# Patient Record
Sex: Female | Born: 1975 | Race: Black or African American | Hispanic: No | Marital: Single | Smoking: Current every day smoker
Health system: Southern US, Community
[De-identification: ages and names within clinical notes are randomized; demographics above are authoritative.]

## PROBLEM LIST (undated history)

## (undated) HISTORY — PX: TUBAL LIGATION: SHX77

---

## 2019-05-08 ENCOUNTER — Emergency Department: Payer: Self-pay

## 2019-05-08 ENCOUNTER — Other Ambulatory Visit: Payer: Self-pay

## 2019-05-08 ENCOUNTER — Encounter: Payer: Self-pay | Admitting: Emergency Medicine

## 2019-05-08 ENCOUNTER — Emergency Department
Admission: EM | Admit: 2019-05-08 | Discharge: 2019-05-08 | Disposition: A | Discharge status: Home / Self Care | Payer: Self-pay | Attending: Emergency Medicine | Admitting: Emergency Medicine

## 2019-05-08 DIAGNOSIS — Y999 Unspecified external cause status: Secondary | ICD-10-CM | POA: Insufficient documentation

## 2019-05-08 DIAGNOSIS — Y9351 Activity, roller skating (inline) and skateboarding: Secondary | ICD-10-CM | POA: Insufficient documentation

## 2019-05-08 DIAGNOSIS — Y929 Unspecified place or not applicable: Secondary | ICD-10-CM | POA: Insufficient documentation

## 2019-05-08 DIAGNOSIS — S93401A Sprain of unspecified ligament of right ankle, initial encounter: Secondary | ICD-10-CM | POA: Insufficient documentation

## 2019-05-08 DIAGNOSIS — F17210 Nicotine dependence, cigarettes, uncomplicated: Secondary | ICD-10-CM | POA: Insufficient documentation

## 2019-05-08 MED ORDER — MELOXICAM 15 MG PO TABS: 15.0000 mg | ORAL_TABLET | Freq: Every day | ORAL | 0 refills | Status: AC

## 2019-05-08 NOTE — ED Triage Notes (Signed)
Injured right ankle yestereday roller skating

## 2019-05-08 NOTE — ED Notes (Signed)
See triage note  Presents s/p fall  States she fell roller skating yesterday  Having pain to right ankle and states pain radiates into calf  Min swelling noted at ankle   Good pulses

## 2019-05-08 NOTE — Discharge Instructions (Signed)
Please follow up with the podiatrist for symptoms that are not improving over the week.  Rest, ice, and elevate your leg off and on over the next few days.  Return to the ER for symptoms of concern if unable to schedule an appointment.

## 2019-05-08 NOTE — ED Provider Notes (Signed)
[facility] Emergency Department Provider Note ____________________________________________  Time seen: Approximately 3:07 PM  I have reviewed the triage vital signs and the nursing notes.   HISTORY  Chief Complaint Ankle Pain    HPI Brighton Delio is a 43 y.o. female who presents to the emergency department for evaluation and treatment of right ankle pain after inversion injury yesterday while roller skating. No previous ankle sprain or fracture. No relief with rest and ibuprofen.  History reviewed. No pertinent past medical history.  There are no active problems to display for this patient.   Past Surgical History:  Procedure Laterality Date  . TUBAL LIGATION      Prior to Admission medications   Medication Sig Start Date End Date Taking? Authorizing Provider  meloxicam (MOBIC) 15 MG tablet Take 1 tablet (15 mg total) by mouth daily. 05/08/19   Victorino Dike, FNP    Allergies Patient has no known allergies.  No family history on file.  Social History Social History   Tobacco Use  . Smoking status: Current Every Day Smoker  . Smokeless tobacco: Never Used  Substance Use Topics  . Alcohol use: Yes  . Drug use: Not on file    Review of Systems Constitutional: Negative for fever. Cardiovascular: Negative for chest pain. Respiratory: Negative for shortness of breath. Musculoskeletal: Positive for right ankle pain. Skin: Positive for swelling over the right ankle.  Neurological: Negative for decrease in sensation  ____________________________________________   PHYSICAL EXAM:  VITAL SIGNS: ED Triage Vitals [05/08/19 1235]  Enc Vitals Group     BP 117/81     Pulse Rate 96     Resp 16     Temp 98.5 F (36.9 C)     Temp Source Oral     SpO2 98 %     Weight 118 lb (53.5 kg)     Height 5\' 2"  (1.575 m)     Head Circumference      Peak Flow      Pain Score 7     Pain Loc      Pain Edu?      Excl. in [place]?      Constitutional: Alert and oriented. Well appearing and in no acute distress. Eyes: Conjunctivae are clear without discharge or drainage Head: Atraumatic Neck: Supple Respiratory: No cough. Respirations are even and unlabored. Musculoskeletal: [place] Ankle rules are negative. Swelling noted over the lateral malleolus without deformity. Neurologic: Motor and sensory function is intact.  Skin: No open wounds or lesions.  Psychiatric: Affect and behavior are appropriate.  ____________________________________________   LABS (all labs ordered are listed, but only abnormal results are displayed)  Labs Reviewed - No data to display ____________________________________________  RADIOLOGY  Image of the right ankle is negative for acute bony abnormality. ____________________________________________   PROCEDURES  .Splint Application  Date/Time: 05/08/2019 3:11 PM Performed by: Victorino Dike, FNP Authorized by: Victorino Dike, FNP   Consent:    Consent obtained:  Verbal   Consent given by:  Patient   Risks discussed:  Pain and swelling Procedure details:    Laterality:  Right   Location:  Ankle   Splint type:  Ankle stirrup   Supplies:  Prefabricated splint Post-procedure details:    Pain:  Unchanged   Patient tolerance of procedure:  Tolerated well, no immediate complications    ____________________________________________   INITIAL IMPRESSION / ASSESSMENT AND PLAN / ED COURSE  Amirra Herling is a 43 y.o. who presents to the  emergency department for treatment and evaluation of right ankle pain. See HPI for details.  X-ray and exam are consistent with ankle sprain. She was placed in a velcro ankle splint and advised to follow up with podiatry if not improving over the week.  She was also instructed to return to the emergency department for symptoms that change or worsen if unable schedule an appointment with podiatry or primary care.  Medications - No data  to display  Pertinent labs & imaging results that were available during my care of the patient were reviewed by me and considered in my medical decision making (see chart for details).  _________________________________________   FINAL CLINICAL IMPRESSION(S) / ED DIAGNOSES  Final diagnoses:  Sprain of right ankle, unspecified ligament, initial encounter    ED Discharge Orders         Ordered    meloxicam (MOBIC) 15 MG tablet  Daily     05/08/19 1348           If controlled substance prescribed during this visit, 12 month history viewed on the [facility] prior to issuing an initial prescription for Schedule II or III opiod.   Chinita Pester, FNP 05/08/19 1511    Emily Filbert, MD 05/09/19 1459

## 2020-02-05 IMAGING — DX DG ANKLE COMPLETE 3+V*R*
3 series · 3 of 3 positions shown · non-contrast
Comparison: None.

CLINICAL DATA: Right ankle pain post fall

EXAM:
RIGHT ANKLE - COMPLETE 3+ VIEW

[ankle ap]
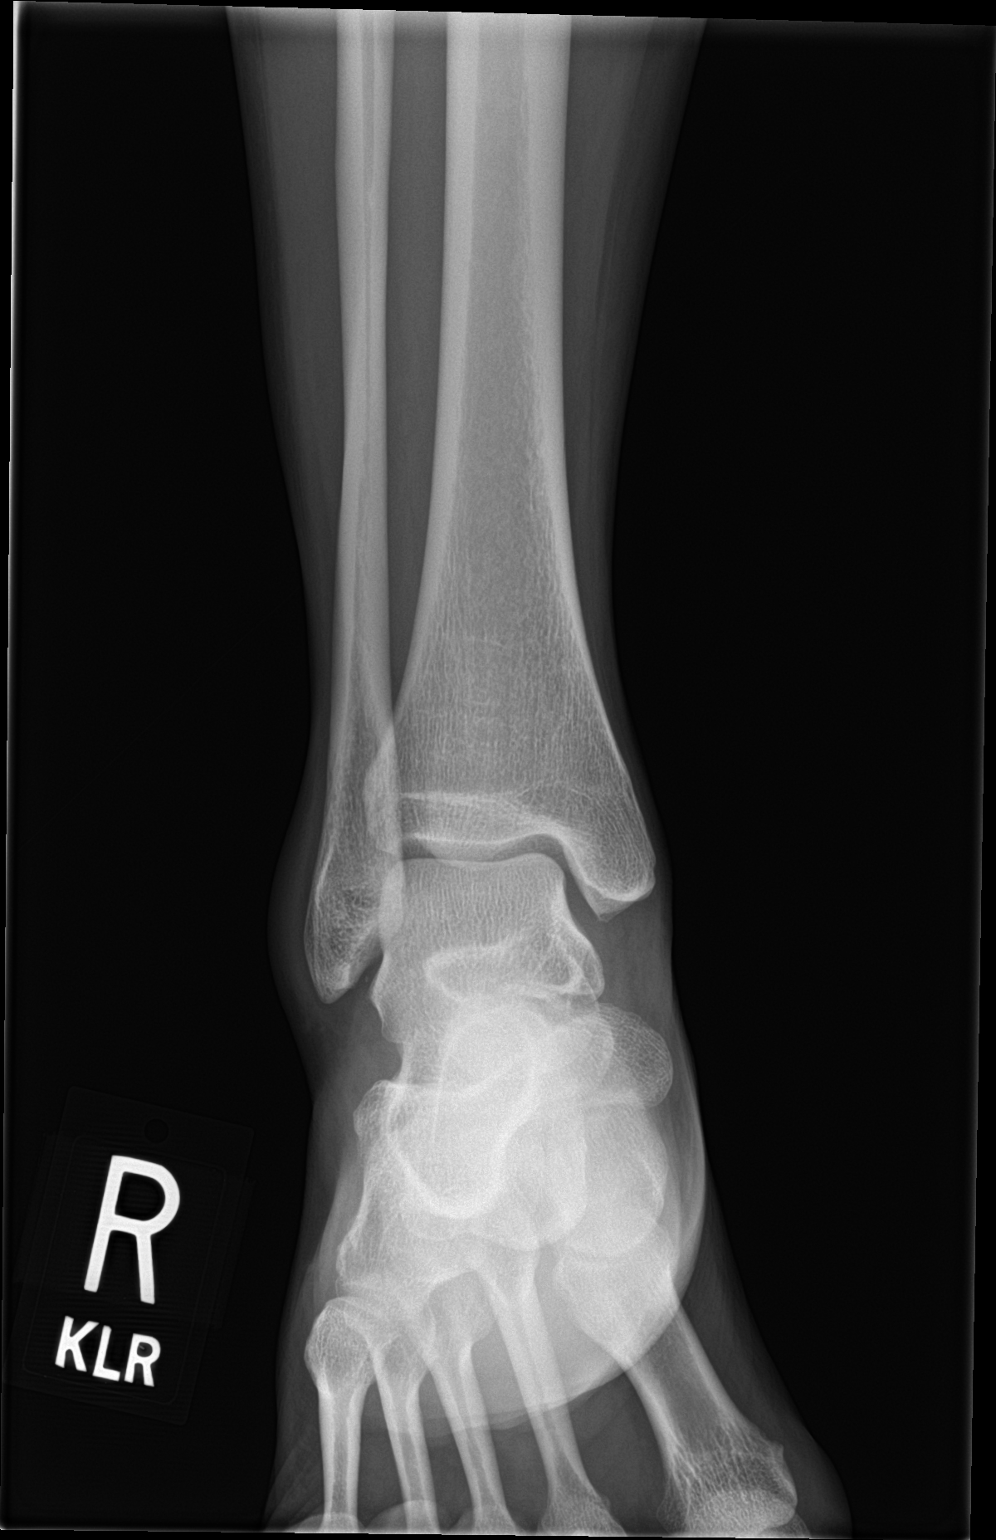

[ankle obl]
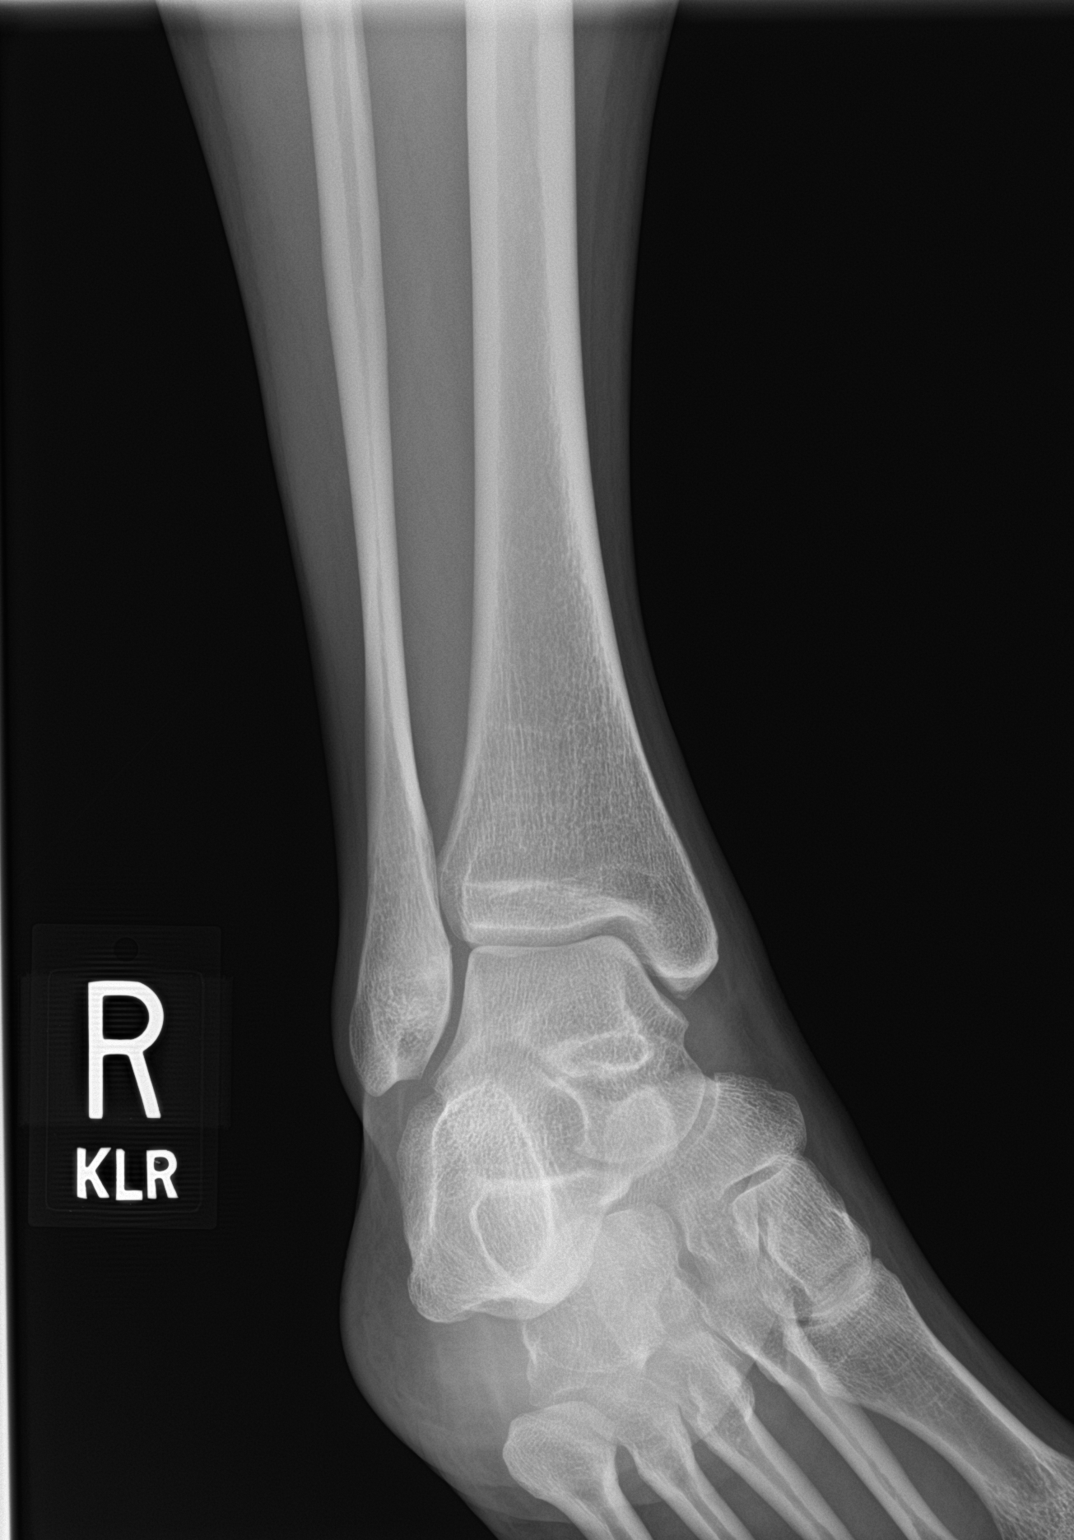

[ankle lat]
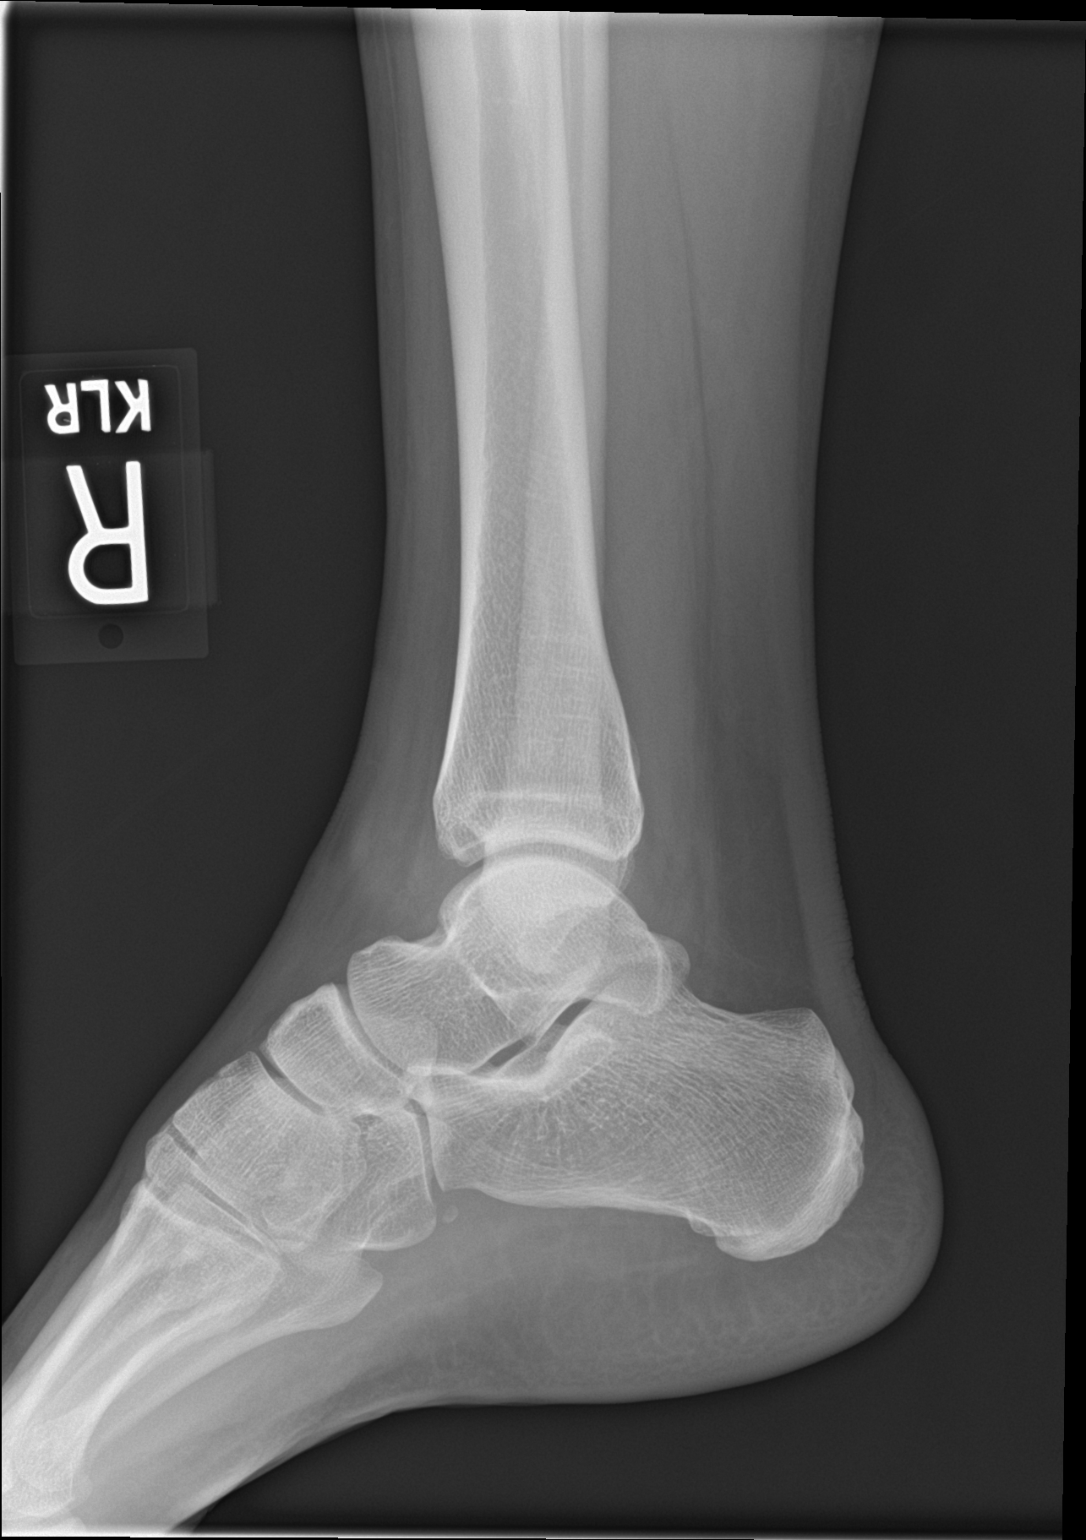

[3 of 3 positions shown; findings below may reference images not displayed]

FINDINGS: There is no evidence of fracture, dislocation, or joint effusion.
There is no evidence of arthropathy or other focal bone abnormality.
Soft tissue swelling about the lateral malleolus.
IMPRESSION: 1. No acute fracture or dislocation identified about the right
ankle.
2. Soft tissue swelling about the lateral malleolus.
